# Patient Record
Sex: Male | Born: 1960 | Race: White | Hispanic: No | Marital: Single | Smoking: Never smoker
Health system: Southern US, Community
[De-identification: ages and names within clinical notes are randomized; demographics above are authoritative.]

---

## 2013-07-11 ENCOUNTER — Emergency Department (HOSPITAL_BASED_OUTPATIENT_CLINIC_OR_DEPARTMENT_OTHER): Payer: Self-pay

## 2013-07-11 ENCOUNTER — Emergency Department (HOSPITAL_BASED_OUTPATIENT_CLINIC_OR_DEPARTMENT_OTHER)
Admission: EM | Admit: 2013-07-11 | Discharge: 2013-07-11 | Disposition: A | Discharge status: Home / Self Care | Payer: Self-pay | Attending: Emergency Medicine | Admitting: Emergency Medicine

## 2013-07-11 ENCOUNTER — Encounter (HOSPITAL_BASED_OUTPATIENT_CLINIC_OR_DEPARTMENT_OTHER): Payer: Self-pay | Admitting: Emergency Medicine

## 2013-07-11 DIAGNOSIS — Z23 Encounter for immunization: Secondary | ICD-10-CM | POA: Insufficient documentation

## 2013-07-11 DIAGNOSIS — K8020 Calculus of gallbladder without cholecystitis without obstruction: Secondary | ICD-10-CM | POA: Insufficient documentation

## 2013-07-11 DIAGNOSIS — S31801A Laceration without foreign body of unspecified buttock, initial encounter: Secondary | ICD-10-CM

## 2013-07-11 DIAGNOSIS — Y9289 Other specified places as the place of occurrence of the external cause: Secondary | ICD-10-CM | POA: Insufficient documentation

## 2013-07-11 DIAGNOSIS — S31809A Unspecified open wound of unspecified buttock, initial encounter: Secondary | ICD-10-CM | POA: Insufficient documentation

## 2013-07-11 DIAGNOSIS — W010XXA Fall on same level from slipping, tripping and stumbling without subsequent striking against object, initial encounter: Secondary | ICD-10-CM | POA: Insufficient documentation

## 2013-07-11 DIAGNOSIS — Y93H2 Activity, gardening and landscaping: Secondary | ICD-10-CM | POA: Insufficient documentation

## 2013-07-11 DIAGNOSIS — W2209XA Striking against other stationary object, initial encounter: Secondary | ICD-10-CM | POA: Insufficient documentation

## 2013-07-11 LAB — BASIC METABOLIC PANEL
BUN: 17 mg/dL (ref 6–23)
CALCIUM: 9.9 mg/dL (ref 8.4–10.5)
CO2: 24 mEq/L (ref 19–32)
Chloride: 98 mEq/L (ref 96–112)
Creatinine, Ser: 0.8 mg/dL (ref 0.50–1.35)
GFR calc Af Amer: >90 mL/min (ref >90)
Glucose, Bld: 122 mg/dL — ABNORMAL HIGH (ref 70–99)
POTASSIUM: 3.9 meq/L (ref 3.7–5.3)
SODIUM: 138 meq/L (ref 137–147)

## 2013-07-11 MED ORDER — TETANUS-DIPHTH-ACELL PERTUSSIS 5-2.5-18.5 LF-MCG/0.5 IM SUSP
0.5000 mL | Freq: Once | INTRAMUSCULAR | Status: AC
  Administered 2013-07-11: 0.5 mL via INTRAMUSCULAR

## 2013-07-11 MED ORDER — DOCUSATE SODIUM 100 MG PO CAPS: 100.0000 mg | ORAL_CAPSULE | Freq: Two times a day (BID) | ORAL | Status: DC

## 2013-07-11 MED ORDER — HYDROCODONE-ACETAMINOPHEN 5-325 MG PO TABS: 1.0000 | ORAL_TABLET | Freq: Four times a day (QID) | ORAL | Status: DC | PRN

## 2013-07-11 MED ORDER — AMOXICILLIN-POT CLAVULANATE 875-125 MG PO TABS
1.0000 | ORAL_TABLET | Freq: Two times a day (BID) | ORAL | Status: DC
Start: 2013-07-11 — End: 2013-07-25

## 2013-07-11 MED ORDER — IOHEXOL 300 MG/ML  SOLN
100.0000 mL | Freq: Once | INTRAMUSCULAR | Status: AC | PRN
  Administered 2013-07-11: 100 mL via INTRAVENOUS

## 2013-07-11 MED ORDER — TETANUS-DIPHTHERIA TOXOIDS TD 5-2 LFU IM INJ: 0.5000 mL | INJECTION | Freq: Once | INTRAMUSCULAR | Status: DC

## 2013-07-11 MED ORDER — IOHEXOL 300 MG/ML  SOLN
50.0000 mL | Freq: Once | INTRAMUSCULAR | Status: AC | PRN
  Administered 2013-07-11: 50 mL via ORAL

## 2013-07-11 MED ORDER — TETANUS-DIPHTH-ACELL PERTUSSIS 5-2.5-18.5 LF-MCG/0.5 IM SUSP
INTRAMUSCULAR | Status: AC
  Filled 2013-07-11: qty 0.5

## 2013-07-11 NOTE — ED Notes (Signed)
Pending for a penrose drain

## 2013-07-11 NOTE — ED Provider Notes (Signed)
CSN: [id number]     Arrival date & time 07/11/13  1710 History  This chart was scribed for Steven HaitWilliam Padme Arriaga, MD by Dorothey Basemania Sutton, ED Scribe. This patient was seen in room MH01/MH01 and the patient's care was started at 5:56 PM.    Chief Complaint  Patient presents with  . Fall   Patient is a 53 y.o. male presenting with fall. The history is provided by the patient. No language interpreter was used.  Fall This is a new problem. The current episode started 1 to 2 hours ago. The problem occurs constantly. The problem has not changed since onset.Pertinent negatives include no abdominal pain. Nothing aggravates the symptoms. Nothing relieves the symptoms. He has tried nothing for the symptoms.   HPI Comments: Adria DillWilliam Espino is a 53 y.o. male who presents to the Emergency Department complaining of a fall that occurred about 2 hours ago when he reports that he tripped while doing yard work, causing him to land on his rectum onto a 0.8 cm in diameter tree branch. Patient is complaining of a small puncture-laceration to the anus with an associated mild pain to the area. The bleeding is well-controlled at this time. He denies perineal numbness, urinary symptoms, or abdominal pain. Patient does not take anticoagulant medication. Patient has no other pertinent medical history.    History reviewed. No pertinent past medical history. History reviewed. No pertinent past surgical history. No family history on file. History  Substance Use Topics  . Smoking status: Never Smoker   . Smokeless tobacco: Not on file  . Alcohol Use: Yes    Review of Systems  Gastrointestinal: Negative for abdominal pain.       Rectal pain  Genitourinary: Negative for difficulty urinating.  Skin: Positive for wound (laceration).  Neurological: Negative for numbness.  All other systems reviewed and are negative.  Allergies  Review of patient's allergies indicates no known allergies.  Home Medications   Prior to Admission  medications   Not on File   Triage Vitals: BP 148/84  Pulse 109  Temp(Src) 98.7 F (37.1 C) (Oral)  Resp 20  Ht 6' (1.829 m)  Wt 200 lb (90.719 kg)  BMI 27.12 kg/m2  SpO2 95%  Physical Exam  Nursing note and vitals reviewed. Constitutional: He is oriented to person, place, and time. He appears well-developed and well-nourished. No distress.  HENT:  Head: Normocephalic and atraumatic.  Eyes: Conjunctivae are normal.  Neck: Normal range of motion. Neck supple.  Pulmonary/Chest: Effort normal. No respiratory distress. He has no wheezes. He exhibits no tenderness.  Abdominal: He exhibits no distension. There is no tenderness. There is no rebound and no guarding.  Genitourinary:  Chaperone (scribe) was present for exam which was performed with no discomfort or complications.   Around the 7:00 position there is a puncture-laceration at the edge of anus, tracking internally. Rectal exam with no tenderness or defects.   Musculoskeletal: Normal range of motion.  Neurological: He is alert and oriented to person, place, and time.  Skin: Skin is warm and dry.  Psychiatric: He has a normal mood and affect. His behavior is normal.    ED Course  LACERATION REPAIR Date/Time: 07/11/2013 10:44 PM Performed by: Steven HaitWALDEN, Shamere Nahzir Pohle Authorized by: Steven HaitWALDEN, Efe Cherilyn Sautter Consent: Verbal consent obtained. Time out: Immediately prior to procedure a "time out" was called to verify the correct patient, procedure, equipment, support staff and site/side marked as required. Body area: anogenital Location details: perianal Laceration length: 6 cm Foreign bodies:  no foreign bodies Tendon involvement: none Nerve involvement: none Vascular damage: no Anesthesia: local infiltration Local anesthetic: lidocaine 2% without epinephrine Anesthetic total: 10 ml Patient sedated: no Preparation: Patient was prepped and draped in the usual sterile fashion. Irrigation solution: saline Irrigation method: jet  lavage Amount of cleaning: standard Debridement: none Degree of undermining: none Skin closure: staples Number of sutures: 5 Technique: complex Approximation: close Patient tolerance: Patient tolerated the procedure well with no immediate complications. Comments: Pinrose drain placed   (including critical care time)  DIAGNOSTIC STUDIES: Oxygen Saturation is 95% on room air, normal by my interpretation.    COORDINATION OF CARE: 6:02 PM- Will consult with the radiologist. Will order a CT of the abdomen and BMP. Discussed treatment plan with patient at bedside and patient verbalized agreement.    Labs Review Labs Reviewed  BASIC METABOLIC PANEL - Abnormal; Notable for the following:    Glucose, Bld 122 (*)    All other components within normal limits    Imaging Review Ct Abdomen Pelvis W Contrast  07/11/2013   CLINICAL DATA:  Penetrating injury to right buttock near anus with tree branch. Question bowel injury. Fall.  EXAM: CT ABDOMEN AND PELVIS WITH CONTRAST  TECHNIQUE: Multidetector CT imaging of the abdomen and pelvis was performed using the standard protocol following bolus administration of intravenous contrast.  CONTRAST:  50mL OMNIPAQUE IOHEXOL 300 MG/ML SOLN, 100mL OMNIPAQUE IOHEXOL 300 MG/ML SOLN  COMPARISON:  None.  FINDINGS: Linear densities in the lung bases compatible with subsegmental atelectasis. Heart is normal size. No effusions.  Small gallstones layering within the gallbladder. Calcifications in the spleen compatible with old granulomatous disease. Liver, pancreas, adrenals are unremarkable. Small cyst in the midpole of the right kidney. Left kidney unremarkable. No hydronephrosis.  Urinary bladder and prostate are unremarkable. Bowel grossly unremarkable. No free fluid, free air, or adenopathy. No evidence of anal/rectal injury. No free air or subcutaneous gas.  Aorta and iliac vessels are normal caliber.  No acute bony abnormality. Degenerative changes in the lower  lumbar spine.  IMPRESSION: Cholelithiasis.  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  No visible evidence of the anal/ rectal injury.   Electronically Signed   By: Charlett NoseKevin  Dover M.D.   On: 07/11/2013 20:18     EKG Interpretation None      MDM   Final diagnoses:  Laceration of buttock, complicated    53M presents with buttock injury. He fell on a sapling, which penetrated through his pants and into his R buttock, adjacent to the anus. Wound goes deep, unable to palpate bottom. Normal rectal exam, normal tone, no tenderness, no deformity noted.  CT without evidence of injury. I spoke with Dr. Lindie SpruceWyatt, who recommended staple closure with drain in place and f/u w/him or Dr. Janee Mornhompson ASAP. Repair as above. Stable for discharge. 5 staples placed, given colace, pain meds, augmentin.  I personally performed the services described in this documentation, which was scribed in my presence. The recorded information has been reviewed and is accurate.      Steven HaitWilliam Kemper Hochman, MD 07/11/13 [id number]

## 2013-07-11 NOTE — ED Notes (Signed)
MD at bedside. 

## 2013-07-11 NOTE — ED Notes (Addendum)
Fell onto scrub approx 2 hours PTA-reports trauma to rectum-states has gauze with neosporin in place with no bleed thru-steady gait into triage-advised to inform staff in any changes while waiting for ED tx area bed-agreed

## 2013-07-11 NOTE — Discharge Instructions (Signed)

## 2013-07-16 ENCOUNTER — Encounter (INDEPENDENT_AMBULATORY_CARE_PROVIDER_SITE_OTHER): Payer: Self-pay | Admitting: General Surgery

## 2013-07-16 ENCOUNTER — Ambulatory Visit (INDEPENDENT_AMBULATORY_CARE_PROVIDER_SITE_OTHER): Payer: Self-pay | Admitting: General Surgery

## 2013-07-16 VITALS — BP 120/75 | HR 74 | Temp 98.2°F | Resp 16 | Ht 72.0 in | Wt 203.4 lb

## 2013-07-16 DIAGNOSIS — S31821A Laceration without foreign body of left buttock, initial encounter: Secondary | ICD-10-CM

## 2013-07-16 DIAGNOSIS — S31809A Unspecified open wound of unspecified buttock, initial encounter: Secondary | ICD-10-CM

## 2013-07-16 NOTE — Progress Notes (Signed)
Subjective:     Patient ID: Adria DillWilliam Schaffert, male   DOB: 03/07/1961, 53 y.o.   MRN: [id number]  HPI  Patient was doing yard work on April 17 when he fell and a sharp sapling pierced his left buttock near his anal area. He was seen in the emergency department. The area was closed over a Penrose drain. The Penrose drain has come out. He is sore but feeling well. Minimal drainage. Moving his bowels. He is taking Augmentin. Review of Systems     Objective:   Physical Exam  Constitutional: He appears well-developed and well-nourished.  Neck: Normal range of motion. Neck supple.  Cardiovascular: Normal rate and normal heart sounds.   Pulmonary/Chest: Effort normal and breath sounds normal.  Abdominal: Soft. He exhibits no distension. There is no tenderness.  Genitourinary:  Left perianal region has 5 staples on small laceration with lateral opening where Penrose drain fell out. No cellulitis. No significant drainage. No signs of infection.       Assessment:     Left perianal buttock laceration, no signs of infection    Plan:     Return next week for staple removal. He will be seen in urgent clinic as I will not be in the office at that time.

## 2013-07-25 ENCOUNTER — Ambulatory Visit (INDEPENDENT_AMBULATORY_CARE_PROVIDER_SITE_OTHER): Payer: Self-pay | Admitting: General Surgery

## 2013-07-25 VITALS — BP 130/82 | HR 78 | Temp 97.2°F | Resp 14 | Ht 72.0 in | Wt 205.4 lb

## 2013-07-25 DIAGNOSIS — S31809A Unspecified open wound of unspecified buttock, initial encounter: Secondary | ICD-10-CM

## 2013-07-25 DIAGNOSIS — S31801A Laceration without foreign body of unspecified buttock, initial encounter: Secondary | ICD-10-CM

## 2013-07-25 NOTE — Patient Instructions (Signed)
Return to the office as needed 

## 2013-07-25 NOTE — Progress Notes (Signed)
Subjective:   Patient ID: Steven DillWilliam Chavez, male DOB: 10/07/1960, 53 y.o. MRN: [id number]  HPI  Patient was doing yard work on April 17 when he fell and a sharp sapling pierced his left buttock near his anal area. He was seen in the emergency department. The area was closed over a Penrose drain. The Penrose drain has come out. He is sore but feeling well. Minimal drainage. Moving his bowels. He is finished with his Augmentin.  Review of Systems  Objective:   Physical Exam  Filed Vitals:   07/25/13 1421  BP: 130/82  Pulse: 78  Temp: 97.2 F (36.2 C)  Resp: 14    Constitutional: He appears well-developed and well-nourished.  Pulmonary/Chest: Effort normal and breath sounds normal.  Abdominal: Soft. He exhibits no distension. There is no tenderness.  Genitourinary:  Left perianal region has 5 staples on small laceration  No cellulitis. No significant drainage. No signs of infection. Staples removed Assessment:   Left perianal buttock laceration, no signs of infection  Plan:   Return to the office as needed

## 2017-05-17 ENCOUNTER — Emergency Department (HOSPITAL_COMMUNITY): Payer: Self-pay

## 2017-05-17 ENCOUNTER — Encounter (HOSPITAL_COMMUNITY): Payer: Self-pay | Admitting: Emergency Medicine

## 2017-05-17 ENCOUNTER — Other Ambulatory Visit: Payer: Self-pay

## 2017-05-17 ENCOUNTER — Emergency Department (HOSPITAL_COMMUNITY)
Admission: EM | Admit: 2017-05-17 | Discharge: 2017-05-17 | Disposition: A | Discharge status: Home / Self Care | Payer: Self-pay | Attending: Emergency Medicine | Admitting: Emergency Medicine

## 2017-05-17 DIAGNOSIS — K8050 Calculus of bile duct without cholangitis or cholecystitis without obstruction: Secondary | ICD-10-CM | POA: Insufficient documentation

## 2017-05-17 DIAGNOSIS — R112 Nausea with vomiting, unspecified: Secondary | ICD-10-CM | POA: Insufficient documentation

## 2017-05-17 DIAGNOSIS — R1011 Right upper quadrant pain: Secondary | ICD-10-CM | POA: Insufficient documentation

## 2017-05-17 LAB — COMPREHENSIVE METABOLIC PANEL
ALT: 32 U/L (ref 17–63)
AST: 23 U/L (ref 15–41)
Albumin: 4 g/dL (ref 3.5–5.0)
Alkaline Phosphatase: 47 U/L (ref 38–126)
Anion gap: 11 (ref 5–15)
BUN: 13 mg/dL (ref 6–20)
CHLORIDE: 97 mmol/L — AB (ref 101–111)
CO2: 24 mmol/L (ref 22–32)
Calcium: 9.1 mg/dL (ref 8.9–10.3)
Creatinine, Ser: 0.78 mg/dL (ref 0.61–1.24)
GFR calc Af Amer: >60 mL/min (ref >60)
GFR calc non Af Amer: >60 mL/min (ref >60)
Glucose, Bld: 160 mg/dL — ABNORMAL HIGH (ref 65–99)
POTASSIUM: 3.7 mmol/L (ref 3.5–5.1)
Sodium: 132 mmol/L — ABNORMAL LOW (ref 135–145)
Total Bilirubin: 0.8 mg/dL (ref 0.3–1.2)
Total Protein: 7.6 g/dL (ref 6.5–8.1)

## 2017-05-17 LAB — URINALYSIS, ROUTINE W REFLEX MICROSCOPIC
Bilirubin Urine: NEGATIVE
Glucose, UA: NEGATIVE mg/dL
Hgb urine dipstick: NEGATIVE
Ketones, ur: NEGATIVE mg/dL
Leukocytes, UA: NEGATIVE
NITRITE: NEGATIVE
Protein, ur: NEGATIVE mg/dL
SPECIFIC GRAVITY, URINE: 1.005 (ref 1.005–1.030)
pH: 6 (ref 5.0–8.0)

## 2017-05-17 LAB — CBC WITH DIFFERENTIAL/PLATELET
BASOS ABS: 0 10*3/uL (ref 0.0–0.1)
Basophils Relative: 0 %
Eosinophils Absolute: 0 10*3/uL (ref 0.0–0.7)
Eosinophils Relative: 0 %
HCT: 39 % (ref 39.0–52.0)
HEMOGLOBIN: 13.6 g/dL (ref 13.0–17.0)
LYMPHS ABS: 1.4 10*3/uL (ref 0.7–4.0)
Lymphocytes Relative: 9 %
MCH: 31.9 pg (ref 26.0–34.0)
MCHC: 34.9 g/dL (ref 30.0–36.0)
MCV: 91.5 fL (ref 78.0–100.0)
Monocytes Absolute: 1.3 10*3/uL — ABNORMAL HIGH (ref 0.1–1.0)
Monocytes Relative: 8 %
NEUTROS PCT: 83 %
Neutro Abs: 13.1 10*3/uL — ABNORMAL HIGH (ref 1.7–7.7)
Platelets: 227 10*3/uL (ref 150–400)
RBC: 4.26 MIL/uL (ref 4.22–5.81)
RDW: 12.3 % (ref 11.5–15.5)
WBC: 15.8 10*3/uL — AB (ref 4.0–10.5)

## 2017-05-17 LAB — LIPASE, BLOOD: Lipase: 31 U/L (ref 11–51)

## 2017-05-17 LAB — I-STAT TROPONIN, ED: Troponin i, poc: 0 ng/mL (ref 0.00–0.08)

## 2017-05-17 MED ORDER — HYDROMORPHONE HCL 1 MG/ML IJ SOLN
1.0000 mg | Freq: Once | INTRAMUSCULAR | Status: AC
  Administered 2017-05-17: 1 mg via INTRAVENOUS
  Filled 2017-05-17: qty 1

## 2017-05-17 MED ORDER — ONDANSETRON HCL 4 MG/2ML IJ SOLN
4.0000 mg | Freq: Once | INTRAMUSCULAR | Status: AC
  Administered 2017-05-17: 4 mg via INTRAVENOUS
  Filled 2017-05-17: qty 2

## 2017-05-17 MED ORDER — HYDROMORPHONE HCL 1 MG/ML IJ SOLN
INTRAMUSCULAR | Status: AC
  Filled 2017-05-17: qty 1

## 2017-05-17 MED ORDER — ONDANSETRON 4 MG PO TBDP: ORAL_TABLET | ORAL | 0 refills | Status: AC

## 2017-05-17 MED ORDER — HYDROCODONE-ACETAMINOPHEN 5-325 MG PO TABS: 1.0000 | ORAL_TABLET | Freq: Four times a day (QID) | ORAL | 0 refills | Status: AC | PRN

## 2017-05-17 MED ORDER — SODIUM CHLORIDE 0.9 % IV BOLUS (SEPSIS)
1000.0000 mL | Freq: Once | INTRAVENOUS | Status: AC
  Administered 2017-05-17: 1000 mL via INTRAVENOUS

## 2017-05-17 NOTE — Discharge Instructions (Signed)
Take motrin for pain.   Stay hydrated. Take zofran for nausea.   Avoid eating fatty food or fried food.   Take vicodin for severe pain. Do NOT drive with it.   See your doctor  Return to ER if you have worse abdominal pain, vomiting, fever.

## 2017-05-17 NOTE — ED Triage Notes (Signed)
Pt complaint of intermittent RUQ pain with n/v for past 1.5 years. "Usually goes away after 30 minutes but this time it didn't; I've had it since last night."

## 2017-05-17 NOTE — ED Notes (Signed)
Bed: WA02 Expected date:  Expected time:  Means of arrival:  Comments: 

## 2017-05-17 NOTE — ED Provider Notes (Signed)
[facility]-EMERGENCY DEPT Provider Note   CSN: [id number] Arrival date & time: 05/17/17  0920     History   Chief Complaint Chief Complaint  Patient presents with  . Abdominal Pain    HPI Steven Chavez is a 57 y.o. male history of gallstones here presenting with right upper quadrant pain.  Patient states that he has intermittent right upper quadrant pain for the last several months.  States that usually is worse after he eats chicken wings and usually last several minutes and goes away by itself.  4 days ago he had an episode of severe right upper quadrant pain associated with some nausea vomiting but it resolved after several hours.  Since last night around 9 PM, he had constant right upper quadrant pain associated with some nausea.  Denies actual vomiting or urinary symptoms.  Patient states that his wife had Dilaudid at home so he took 1 dose of Dilaudid and pain improved.  Of note, patient had an injury to his abdomen several years ago and had a CT abdomen pelvis done at that time and incidentally noted some gallstones but he never followed up with surgery.   The history is provided by the patient.    History reviewed. No pertinent past medical history.  There are no active problems to display for this patient.   History reviewed. No pertinent surgical history.     Home Medications    Prior to Admission medications   Medication Sig Start Date End Date Taking? Authorizing Provider  acetaminophen (TYLENOL) 500 MG tablet Take 500 mg by mouth every 6 (six) hours as needed for mild pain, fever or headache.   Yes [provider]  HYDROmorphone (DILAUDID) 2 MG tablet Take 2 mg by mouth every 4 (four) hours as needed for severe pain.   Yes [provider]    Family History No family history on file.  Social History Social History   Tobacco Use  . Smoking status: Never Smoker  Substance Use Topics  . Alcohol use: Yes  . Drug use: No       Allergies   Patient has no known allergies.   Review of Systems Review of Systems  Gastrointestinal: Positive for abdominal pain and nausea.  All other systems reviewed and are negative.    Physical Exam Updated Vital Signs BP 138/74   Pulse 92   Temp 98.1 F (36.7 C) (Oral)   Resp 15   SpO2 96%   Physical Exam  Constitutional: He appears well-developed.  Uncomfortable   HENT:  Head: Normocephalic.  Mouth/Throat: Oropharynx is clear and moist.  Eyes: EOM are normal. Pupils are equal, round, and reactive to light.  Cardiovascular: Normal rate.  Pulmonary/Chest: Effort normal.  Abdominal: Soft. Normal appearance and bowel sounds are normal.  + RUQ tenderness, mild murphy's   Neurological: He is alert.  Skin: Skin is warm. Capillary refill takes less than 2 seconds.  Psychiatric: He has a normal mood and affect.  Nursing note and vitals reviewed.    ED Treatments / Results  Labs (all labs ordered are listed, but only abnormal results are displayed) Labs Reviewed  COMPREHENSIVE METABOLIC PANEL - Abnormal; Notable for the following components:      Result Value   Sodium 132 (*)    Chloride 97 (*)    Glucose, Bld 160 (*)    All other components within normal limits  URINALYSIS, ROUTINE W REFLEX MICROSCOPIC - Abnormal; Notable for the following components:  Color, Urine STRAW (*)    All other components within normal limits  CBC WITH DIFFERENTIAL/PLATELET - Abnormal; Notable for the following components:   WBC 15.8 (*)    Neutro Abs 13.1 (*)    Monocytes Absolute 1.3 (*)    All other components within normal limits  LIPASE, BLOOD  I-STAT TROPONIN, ED    EKG  EKG Interpretation  Date/Time:  Thursday May 17 2017 10:28:56 EST Ventricular Rate:  73 PR Interval:    QRS Duration: 92 QT Interval:  389 QTC Calculation: 429 R Axis:   70 Text Interpretation:  Sinus rhythm Consider right atrial enlargement No previous ECGs available Confirmed by Richardean Canal [phone]) on 05/17/2017 10:31:52 AM       Radiology US Abdomen Limited Ruq  Result Date: 05/17/2017 CLINICAL DATA:  Acute right upper quadrant pain. EXAM: ULTRASOUND ABDOMEN LIMITED RIGHT UPPER QUADRANT COMPARISON:  None. FINDINGS: Gallbladder: Gallstones are noted, the largest measures 1.4 cm. Gallbladder wall thickness 2.5 mm. Negative Murphy sign. Common bile duct: Diameter: 2.8 mm Liver: No focal lesion identified. Within normal limits in parenchymal echogenicity. Portal vein is patent on color Doppler imaging with normal direction of blood flow towards the liver. IMPRESSION: Gallstones are noted.  Largest measures 1.4 cm. Electronically Signed   By: Maisie Fus  Register   On: 05/17/2017 11:05    Procedures Procedures (including critical care time)  Medications Ordered in ED Medications  HYDROmorphone (DILAUDID) 1 MG/ML injection (not administered)  sodium chloride 0.9 % bolus 1,000 mL (1,000 mLs Intravenous New Bag/Given 05/17/17 1108)  ondansetron (ZOFRAN) injection 4 mg (4 mg Intravenous Given 05/17/17 1111)  HYDROmorphone (DILAUDID) injection 1 mg (1 mg Intravenous Given 05/17/17 1114)     Initial Impression / Assessment and Plan / ED Course  I have reviewed the triage vital signs and the nursing notes.  Pertinent labs & imaging results that were available during my care of the patient were reviewed by me and considered in my medical decision making (see chart for details).     Steven Chavez is a 57 y.o. male here with RUQ pain. Likely biliary colic vs acute chole vs pancreatitis. Hx of gallstones on previous US. Will get labs, lipase, RUQ [place]. Will give pain meds.    1:30 PM WBC 15. LFTs nl, lipase nl. US showed gallstones with no cholecystitis. Pain controlled. Will dc home with vicodin, zofran. Will refer to surgery outpatient for cholecystectomy  Final Clinical Impressions(s) / ED Diagnoses   Final diagnoses:  RUQ pain    ED Discharge Orders    None       Charlynne Pander, MD 05/17/17 1330

## 2018-12-04 IMAGING — US US ABDOMEN LIMITED
1 series · 14 of 25 positions shown · non-contrast
Comparison: None.

CLINICAL DATA: Acute right upper quadrant pain.

EXAM:
ULTRASOUND ABDOMEN LIMITED RIGHT UPPER QUADRANT

[Series 1: us abdomen limited · 0.25mm/px · 14 of 57 slices shown]
[im 1/57]
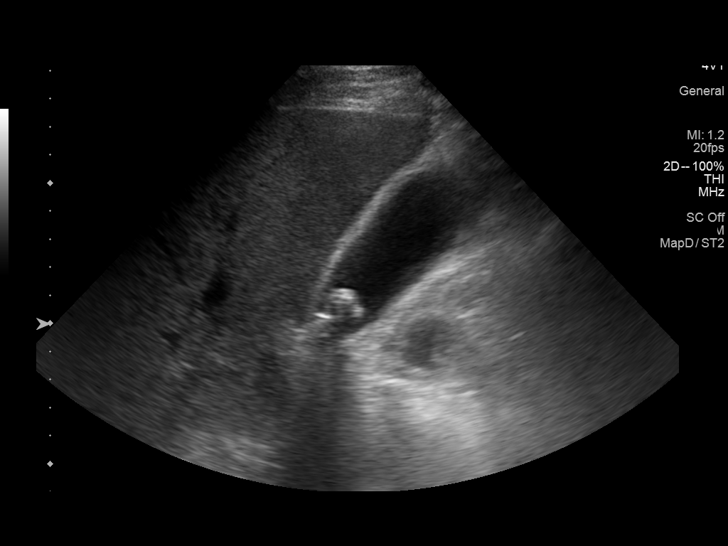
[im 5/57]
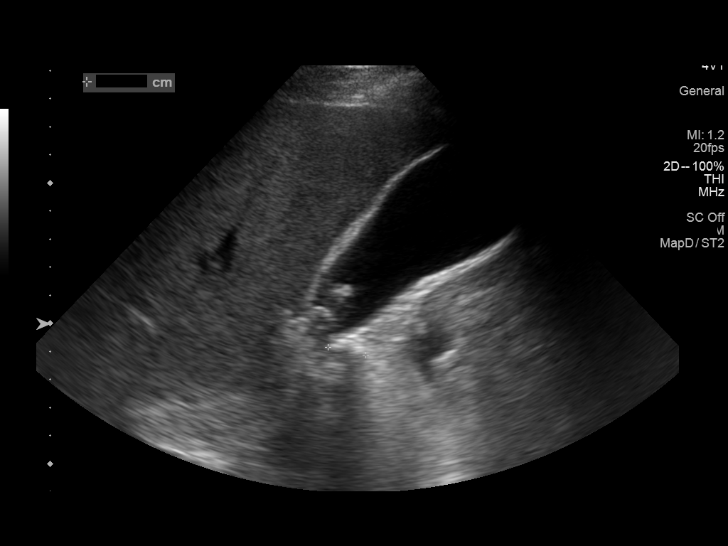
[im 10/57]
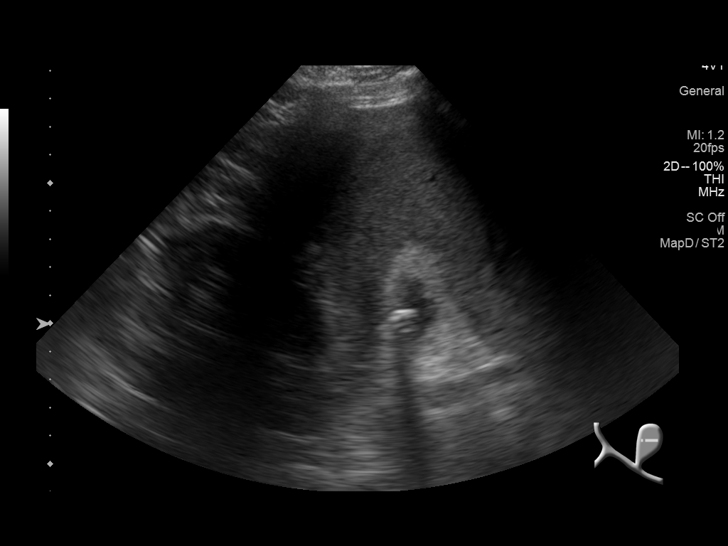
[im 15/57]
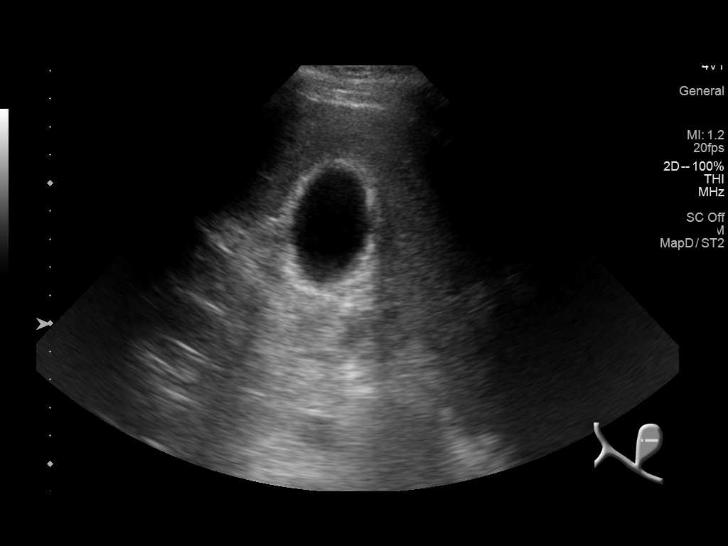
[im 19/57]
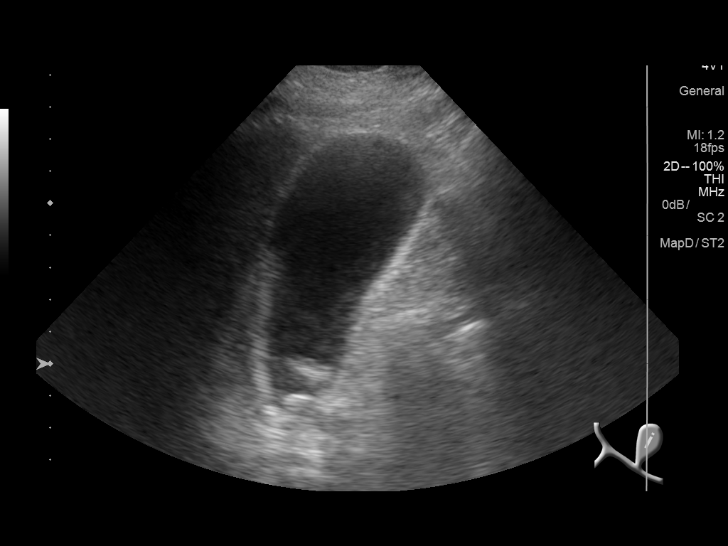
[im 22/57]
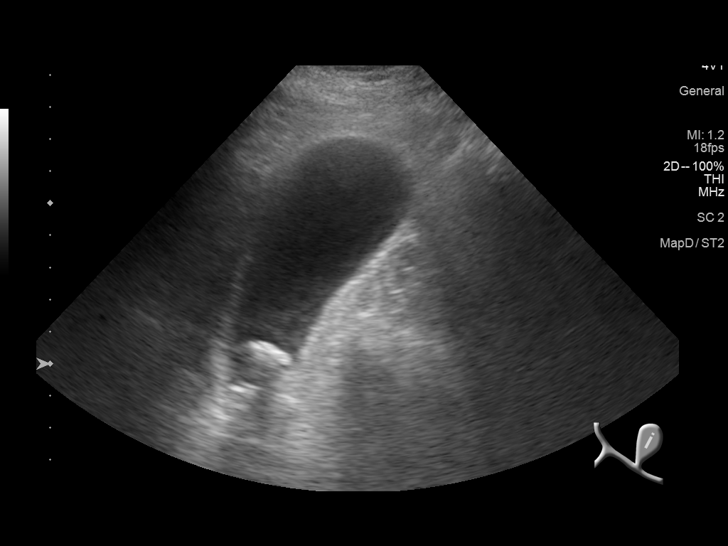
[im 26/57]
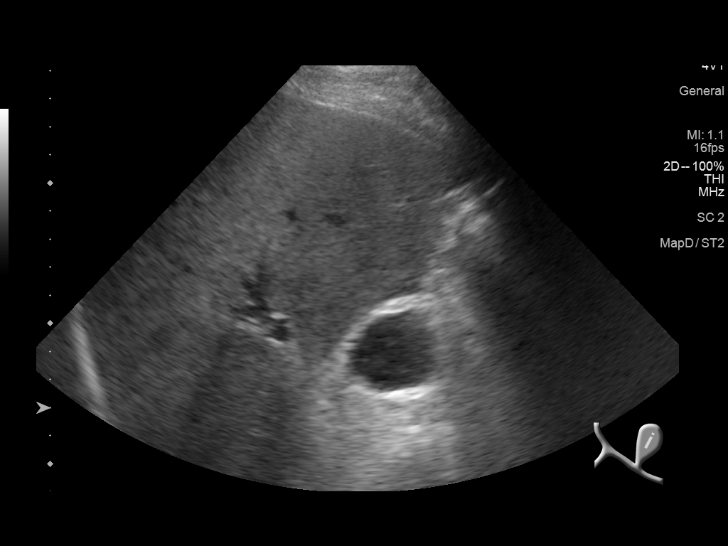
[im 31/57]
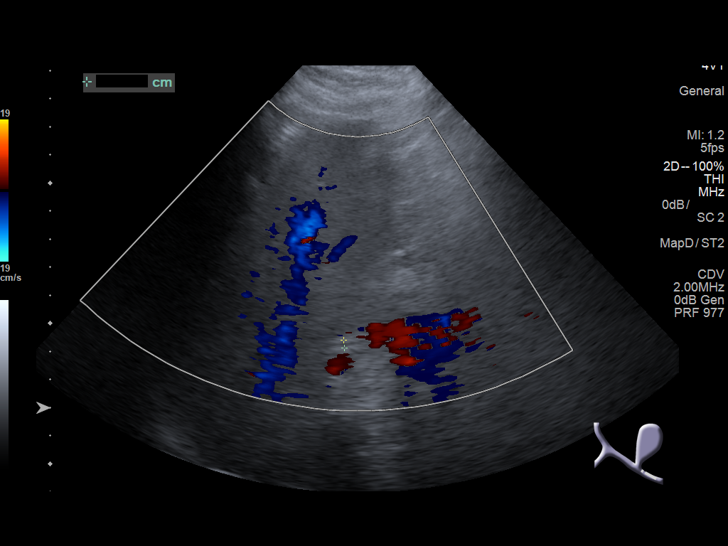
[im 36/57]
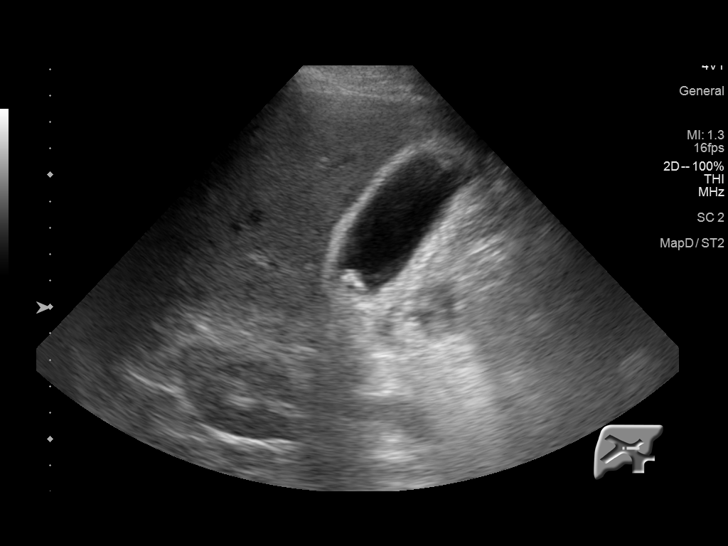
[im 38/57]
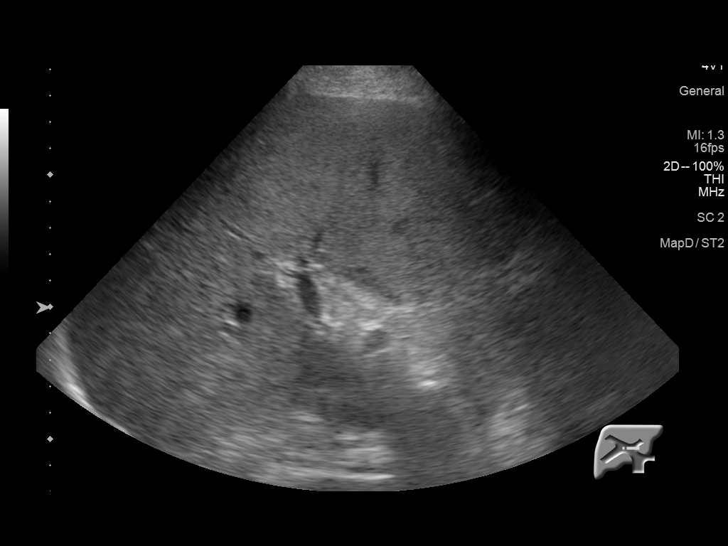
[im 43/57]
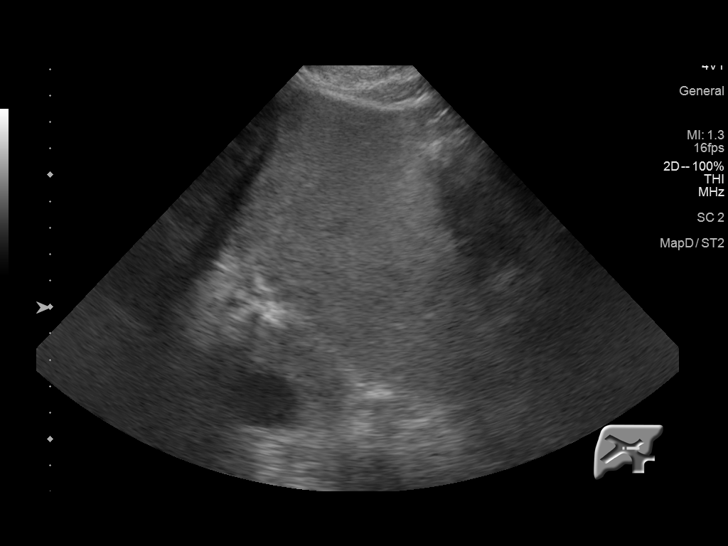
[im 47/57]
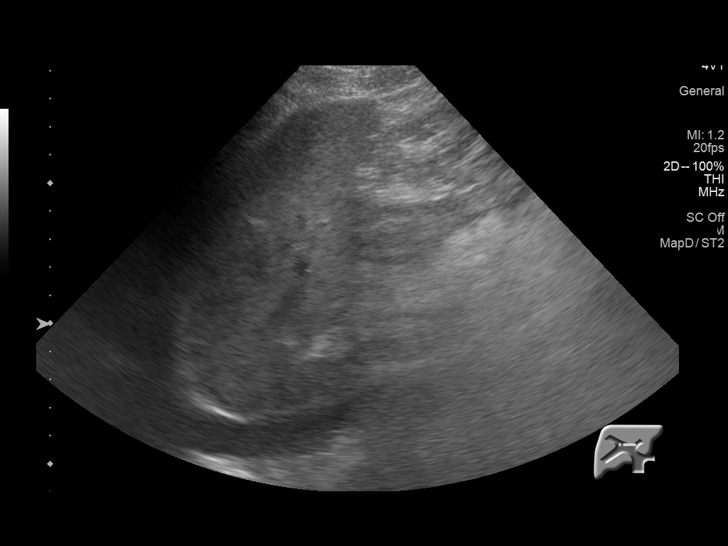
[im 52/57]
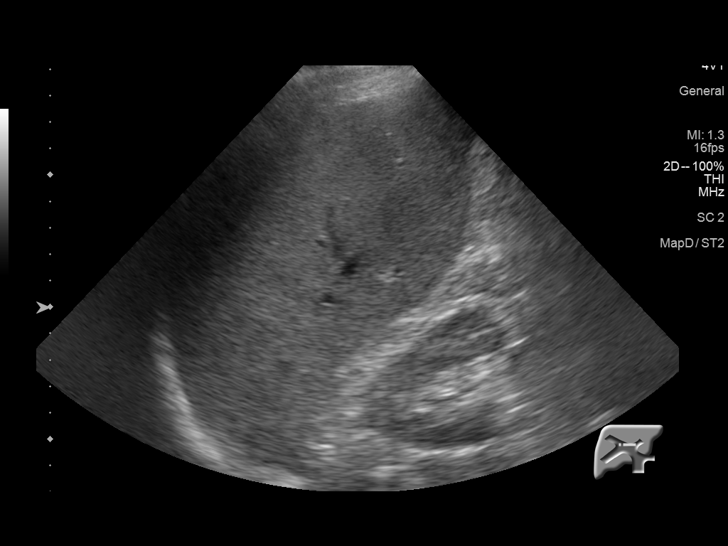
[im 57/57]
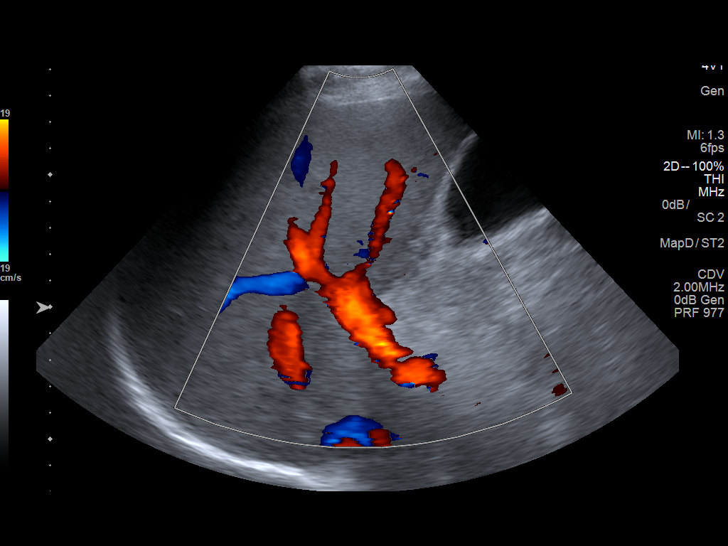

[14 of 25 positions shown; findings below may reference images not displayed]

FINDINGS: Gallbladder:

Gallstones are noted, the largest measures 1.4 cm. Gallbladder wall
thickness 2.5 mm. Negative Murphy sign.

Common bile duct:

Diameter: 2.8 mm

Liver:

No focal lesion identified. Within normal limits in parenchymal
echogenicity. Portal vein is patent on color Doppler imaging with
normal direction of blood flow towards the liver.
IMPRESSION: Gallstones are noted.  Largest measures 1.4 cm.
